# Patient Record
Sex: Female | Born: 1960 | Race: White | Hispanic: No | Marital: Married | Smoking: Never smoker
Health system: Southern US, Community
[De-identification: ages and names within clinical notes are randomized; demographics above are authoritative.]

## PROBLEM LIST (undated history)

## (undated) DIAGNOSIS — F419 Anxiety disorder, unspecified: Secondary | ICD-10-CM

## (undated) DIAGNOSIS — C50919 Malignant neoplasm of unspecified site of unspecified female breast: Secondary | ICD-10-CM

## (undated) DIAGNOSIS — I10 Essential (primary) hypertension: Secondary | ICD-10-CM

## (undated) HISTORY — PX: ABDOMINAL HYSTERECTOMY: SHX81

---

## 2010-06-28 DIAGNOSIS — C50919 Malignant neoplasm of unspecified site of unspecified female breast: Secondary | ICD-10-CM

## 2010-06-28 HISTORY — DX: Malignant neoplasm of unspecified site of unspecified female breast: C50.919

## 2010-06-28 HISTORY — PX: BREAST LUMPECTOMY: SHX2

## 2018-07-30 ENCOUNTER — Emergency Department (INDEPENDENT_AMBULATORY_CARE_PROVIDER_SITE_OTHER)
Admission: EM | Admit: 2018-07-30 | Discharge: 2018-07-30 | Disposition: A | Discharge status: Home / Self Care | Payer: BC Managed Care – PPO | Source: Home / Self Care | Attending: Family Medicine | Admitting: Family Medicine

## 2018-07-30 ENCOUNTER — Emergency Department (INDEPENDENT_AMBULATORY_CARE_PROVIDER_SITE_OTHER): Payer: BC Managed Care – PPO

## 2018-07-30 DIAGNOSIS — R05 Cough: Secondary | ICD-10-CM | POA: Diagnosis not present

## 2018-07-30 DIAGNOSIS — R69 Illness, unspecified: Secondary | ICD-10-CM

## 2018-07-30 DIAGNOSIS — R110 Nausea: Secondary | ICD-10-CM

## 2018-07-30 DIAGNOSIS — R509 Fever, unspecified: Secondary | ICD-10-CM

## 2018-07-30 DIAGNOSIS — J111 Influenza due to unidentified influenza virus with other respiratory manifestations: Secondary | ICD-10-CM

## 2018-07-30 HISTORY — DX: Anxiety disorder, unspecified: F41.9

## 2018-07-30 HISTORY — DX: Malignant neoplasm of unspecified site of unspecified female breast: C50.919

## 2018-07-30 HISTORY — DX: Essential (primary) hypertension: I10

## 2018-07-30 MED ORDER — ONDANSETRON 4 MG PO TBDP
4.0000 mg | ORAL_TABLET | Freq: Once | ORAL | Status: AC
  Administered 2018-07-30: 4 mg via ORAL

## 2018-07-30 MED ORDER — ONDANSETRON 4 MG PO TBDP: ORAL_TABLET | ORAL | 0 refills | Status: AC

## 2018-07-30 MED ORDER — AZITHROMYCIN 250 MG PO TABS: ORAL_TABLET | ORAL | 0 refills | Status: AC

## 2018-07-30 NOTE — ED Triage Notes (Signed)
Erin Leblanc complains of body aches, fever and cough. She just returned last week from a cruise to the [place]. She states she had a fever of 101.0 on Friday. Saturday she had vomiting. Today she feels dehydrated.

## 2018-07-30 NOTE — ED Provider Notes (Signed)
[facility]    CSN: [id number] Arrival date & time: 07/30/18  1110     History   Chief Complaint Chief Complaint  Patient presents with  . Cough  . Fever  . Generalized Body Aches    HPI Erin Leblanc is a 58 y.o. female.   Patient returned from a cruise to the [place] last week. Six days ago patient developed flu-like illness including myalgias, fever 101.5/chills, fatigue, sore throat and cough.  She has had minimal nasal congestion.  Her cough is non-productive and somewhat worse at night.  Yesterday she developed nausea/vomiting and today she feels dehydrated although her vomiting has ceased.  The history is provided by the patient.    Past Medical History:  Diagnosis Date  . Anxiety   . Breast cancer ([place]) 2012  . Hypertension     Active problems:  Adjustment disorder with mixed anxiety and depressed mood; hypertension    Past Surgical History:  Procedure Laterality Date  . ABDOMINAL HYSTERECTOMY    . BREAST LUMPECTOMY Left 2012       Home Medications    Prior to Admission medications   Medication Sig Start Date End Date Taking? Authorizing Provider  benzonatate (TESSALON) 100 MG capsule Take 100 mg by mouth 3 (three) times daily as needed. 07/27/18  Yes [provider]  hydrochlorothiazide (HYDRODIURIL) 12.5 MG tablet Take 12.5 mg by mouth daily.   Yes [provider]  NON FORMULARY [facility] tablet once a day   Yes [provider]  venlafaxine XR (EFFEXOR-XR) 37.5 MG 24 hr capsule Take 37.5 mg by mouth daily. 02/15/13  Yes [provider]  azithromycin (ZITHROMAX Z-PAK) 250 MG tablet Take 2 tabs today; then begin one tab once daily for 4 more days. 07/30/18   Kandra Nicolas, MD  ondansetron (ZOFRAN ODT) 4 MG disintegrating tablet Take one tab by mouth Q6hr prn nausea.  Dissolve under tongue. 07/30/18   Kandra Nicolas, MD    Family History Family History  Problem Relation Age of Onset  . Diabetes  Father     Social History Social History   Tobacco Use  . Smoking status: Never Smoker  . Smokeless tobacco: Never Used  Substance Use Topics  . Alcohol use: Yes    Comment: rare  . Drug use: Never     Allergies   Iodinated diagnostic agents and Shellfish allergy   Review of Systems Review of Systems + sore throat + cough No pleuritic pain No wheezing ? nasal congestion No post-nasal drainage No sinus pain/pressure No itchy/red eyes No earache No hemoptysis No SOB + fever, + chills + nausea + vomiting, resolved No abdominal pain No diarrhea No urinary symptoms No skin rash + fatigue + myalgias No headache Used OTC meds without relief   Physical Exam Triage Vital Signs ED Triage Vitals  Enc Vitals Group     BP 07/30/18 1137 138/84     Pulse Rate 07/30/18 1137 74     Resp 07/30/18 1137 16     Temp 07/30/18 1137 98.1 F (36.7 C)     Temp Source 07/30/18 1137 Oral     SpO2 07/30/18 1137 100 %     Weight 07/30/18 1138 159 lb (72.1 kg)     Height 07/30/18 1138 5\' 5"  (1.651 m)     Head Circumference --      Peak Flow --      Pain Score 07/30/18 1138 0     Pain Loc --  Pain Edu? --      Excl. in [place]? --    No data found.  Updated Vital Signs BP 138/84 (BP Location: Right Arm)   Pulse 74   Temp 98.1 F (36.7 C) (Oral)   Resp 16   Ht 5\' 5"  (1.651 m)   Wt 72.1 kg   SpO2 100%   BMI 26.46 kg/m   Visual Acuity Right Eye Distance:   Left Eye Distance:   Bilateral Distance:    Right Eye Near:   Left Eye Near:    Bilateral Near:     Physical Exam Nursing notes and Vital Signs reviewed. Appearance:  Patient appears stated age, and in no acute distress Eyes:  Pupils are equal, round, and reactive to light and accomodation.  Extraocular movement is intact.  Conjunctivae are not inflamed  Ears:  Canals normal.  Tympanic membranes normal.  Nose:  Mildly congested turbinates.  No sinus tenderness. Mouth:  Decreased moisture  Pharynx:   Normal Neck:  Supple.  Enlarged posterior/lateral nodes are palpated bilaterally, tender to palpation on the left.   Lungs:  Clear to auscultation.  Breath sounds are equal.  Moving air well. Heart:  Regular rate and rhythm without murmurs, rubs, or gallops.  Abdomen:  Nontender without masses or hepatosplenomegaly.  Bowel sounds are present.  No CVA or flank tenderness.  Extremities:  No edema.  Skin:  No rash present.    UC Treatments / Results  Labs (all labs ordered are listed, but only abnormal results are displayed) Labs Reviewed  POCT CBC W AUTO DIFF ([facility]):  WBC 3.9; LY 39.6; MO 7.7; GR 52.7; Hgb 11.7; Platelets 259     EKG None  Radiology Dg Chest 2 View  Result Date: 07/30/2018 CLINICAL DATA:  Body aches, fever and cough for 6 days. EXAM: CHEST - 2 VIEW COMPARISON:  None. FINDINGS: Lungs clear. Heart size normal. No pneumothorax or pleural effusion. No acute or focal bony abnormality. IMPRESSION: No acute disease. Electronically Signed   By: Inge Rise M.D.   On: 07/30/2018 12:07    Procedures Procedures (including critical care time)  Medications Ordered in UC Medications  ondansetron (ZOFRAN-ODT) disintegrating tablet 4 mg (has no administration in time range)    Initial Impression / Assessment and Plan / UC Course  I have reviewed the triage vital signs and the nursing notes.  Pertinent labs & imaging results that were available during my care of the patient were reviewed by me and considered in my medical decision making (see chart for details).    Patient has missed 48 hour window of opportunity for beginning Tamiflu.  Negative chest x-ray reassuring.  Note mild leukopenia on CBC. Administered Zofran ODT 4mg  PO; given Rx for same. Begin empiric Z-pak. Followup with Family Doctor if not improved in about 5 days.   Final Clinical Impressions(s) / UC Diagnoses   Final diagnoses:  Nausea without vomiting  Influenza-like illness      Discharge Instructions     Take plain guaifenesin (1200mg  extended release tabs such as Mucinex) twice daily, with plenty of water, for cough and congestion. Get adequate rest.   May use Afrin nasal spray (or generic oxymetazoline) each morning for about 5 days and then discontinue.  Also recommend using saline nasal spray several times daily and saline nasal irrigation (AYR is a common brand).   Try warm salt water gargles for sore throat.  May take Delsym Cough Suppressant at bedtime for nighttime cough.  May take Ibuprofen 200mg , 4 tabs every 8 hours with food for body aches, fever, etc.      ED Prescriptions    Medication Sig Dispense Auth. Provider   azithromycin (ZITHROMAX Z-PAK) 250 MG tablet Take 2 tabs today; then begin one tab once daily for 4 more days. 6 tablet Kandra Nicolas, MD   ondansetron (ZOFRAN ODT) 4 MG disintegrating tablet Take one tab by mouth Q6hr prn nausea.  Dissolve under tongue. 12 tablet Kandra Nicolas, MD        Kandra Nicolas, MD 07/31/18 Kathyrn Drown

## 2018-07-30 NOTE — Discharge Instructions (Addendum)
Take plain guaifenesin (1200mg  extended release tabs such as Mucinex) twice daily, with plenty of water, for cough and congestion. Get adequate rest.   May use Afrin nasal spray (or generic oxymetazoline) each morning for about 5 days and then discontinue.  Also recommend using saline nasal spray several times daily and saline nasal irrigation (AYR is a common brand).   Try warm salt water gargles for sore throat.  May take Delsym Cough Suppressant at bedtime for nighttime cough.  May take Ibuprofen 200mg , 4 tabs every 8 hours with food for body aches, fever, etc.

## 2018-08-02 LAB — POCT CBC W AUTO DIFF (K'VILLE URGENT CARE)

## 2019-08-02 ENCOUNTER — Other Ambulatory Visit: Payer: Self-pay

## 2019-08-02 ENCOUNTER — Emergency Department (INDEPENDENT_AMBULATORY_CARE_PROVIDER_SITE_OTHER): Payer: BC Managed Care – PPO

## 2019-08-02 ENCOUNTER — Emergency Department (INDEPENDENT_AMBULATORY_CARE_PROVIDER_SITE_OTHER)
Admission: EM | Admit: 2019-08-02 | Discharge: 2019-08-02 | Disposition: A | Discharge status: Home / Self Care | Payer: BC Managed Care – PPO | Source: Home / Self Care | Attending: Family Medicine | Admitting: Family Medicine

## 2019-08-02 DIAGNOSIS — S335XXA Sprain of ligaments of lumbar spine, initial encounter: Secondary | ICD-10-CM | POA: Diagnosis not present

## 2019-08-02 DIAGNOSIS — M4316 Spondylolisthesis, lumbar region: Secondary | ICD-10-CM | POA: Diagnosis not present

## 2019-08-02 DIAGNOSIS — M5431 Sciatica, right side: Secondary | ICD-10-CM

## 2019-08-02 MED ORDER — METHYLPREDNISOLONE ACETATE 80 MG/ML IJ SUSP
80.0000 mg | Freq: Once | INTRAMUSCULAR | Status: AC
  Administered 2019-08-02: 13:00:00 80 mg via INTRAMUSCULAR

## 2019-08-02 NOTE — ED Provider Notes (Signed)
[facility]    CSN: [id number] Arrival date & time: 08/02/19  1123      History   Chief Complaint Chief Complaint  Patient presents with  . Back Pain    HPI Behati Newsum is a 59 y.o. female.   Patient was playing soccer with kids at 4pm yesterday when she fell, landing on her right side.  She had minimal discomfort initially, but later had intermittent right lower back pain with lancinating pain to her right lateral leg.  Today she has persistent right lower back ache radiating intermittently to her right posterior thigh.   She denies bowel or bladder dysfunction, and no saddle numbness.   The history is provided by the patient.  Back Pain Location:  Lumbar spine Quality:  Aching Radiates to:  R posterior upper leg Pain severity:  Moderate Pain is:  Worse during the day Onset quality:  Gradual Duration:  1 day Timing:  Intermittent Progression:  Unchanged Chronicity:  New Context: falling   Relieved by:  Being still Worsened by:  Ambulation Ineffective treatments:  NSAIDs Associated symptoms: paresthesias   Associated symptoms: no abdominal pain, no abdominal swelling, no bladder incontinence, no bowel incontinence, no chest pain, no dysuria, no fever, no leg pain, no numbness, no pelvic pain, no perianal numbness, no tingling and no weakness     Past Medical History:  Diagnosis Date  . Anxiety   . Breast cancer ([place]) 2012  . Hypertension     Active problems: Essential hypertension 08/23/2018  Recurrent major depressive disorder, in full remission 08/23/2018  Actinic keratosis 08/23/2018  Ganglion cyst 08/23/2018  Microcytic anemia 09/25/2015  Thalassemia trait 03/18/2013  Overview:   History of microcytic anemia or family history of Mediterranean anemia. 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 ABDOMINAL HYSTERECTOMY    . BREAST LUMPECTOMY Left 2012       Home Medications    Prior to Admission medications   Medication Sig Start Date End Date Taking? Authorizing Provider  azithromycin (ZITHROMAX Z-PAK) 250 MG tablet Take 2 tabs today; then begin one tab once daily for 4 more days. 07/30/18   Kandra Nicolas, MD  benzonatate (TESSALON) 100 MG capsule Take 100 mg by mouth 3 (three) times daily as needed. 07/27/18   [provider]  hydrochlorothiazide (HYDRODIURIL) 12.5 MG tablet Take 12.5 mg by mouth daily.    [provider]  NON FORMULARY [facility] tablet once a day    [provider]  ondansetron (ZOFRAN ODT) 4 MG disintegrating tablet Take one tab by mouth Q6hr prn nausea.  Dissolve under tongue. 07/30/18   Kandra Nicolas, MD  venlafaxine XR (EFFEXOR-XR) 37.5 MG 24 hr capsule Take 37.5 mg by mouth daily. 02/15/13   [provider]    Family History Family History  Problem Relation Age of Onset  . Diabetes Father     Social History Social History   Tobacco Use  . Smoking status: Never Smoker  . Smokeless tobacco: Never Used  Substance Use Topics  . Alcohol use: Yes  Comment: rare  . Drug use: Never     Allergies   Iodinated diagnostic agents and Shellfish allergy   Review of Systems Review of Systems  Constitutional: Negative for fever.  Cardiovascular: Negative for chest pain.  Gastrointestinal: Negative for abdominal pain and bowel incontinence.  Genitourinary: Negative for bladder incontinence, dysuria and pelvic pain.  Musculoskeletal: Positive for back pain.  Neurological: Positive for paresthesias. Negative for tingling, weakness and  numbness.  All other systems reviewed and are negative.    Physical Exam Triage Vital Signs ED Triage Vitals  Enc Vitals Group     BP 08/02/19 1143 (!) 142/88     Pulse Rate 08/02/19 1143 78     Resp 08/02/19 1143 20     Temp 08/02/19 1143 (!) 96.9 F (36.1 C)     Temp Source 08/02/19 1143 Oral     SpO2 08/02/19 1143 99 %     Weight 08/02/19 1145 169 lb (76.7 kg)     Height 08/02/19 1145 5\' 5"  (1.651 m)     Head Circumference --      Peak Flow --      Pain Score 08/02/19 1144 0     Pain Loc --      Pain Edu? --      Excl. in [place]? --    No data found.  Updated Vital Signs BP (!) 142/88 (BP Location: Right Arm)   Pulse 78   Temp (!) 96.9 F (36.1 C) (Oral)   Resp 20   Ht 5\' 5"  (1.651 m)   Wt 76.7 kg   SpO2 99%   BMI 28.12 kg/m   Visual Acuity Right Eye Distance:   Left Eye Distance:   Bilateral Distance:    Right Eye Near:   Left Eye Near:    Bilateral Near:     Physical Exam Vitals and nursing note reviewed.  Constitutional:      General: She is not in acute distress. HENT:     Head: Normocephalic.  Eyes:     Pupils: Pupils are equal, round, and reactive to light.  Cardiovascular:     Heart sounds: Normal heart sounds.  Pulmonary:     Breath sounds: Normal breath sounds.  Abdominal:     General: Abdomen is flat.     Tenderness: There is no abdominal tenderness.  Musculoskeletal:     Cervical back: Normal range of motion.     Lumbar back: Tenderness and bony tenderness present. No swelling or deformity. Normal range of motion. Negative right straight leg raise test and negative left straight leg raise test. No scoliosis.       Back:     Comments: Back:  Range of motion decreased. Can heel/toe walk and squat without difficulty. Tenderness in the midline approximately L3.  No paraspinous tenderness.  Straight leg raising test is negative.  Sitting knee extension test is negative.  Strength and sensation in the lower extremities is normal.  Patellar and  achilles reflexes are normal   Skin:    General: Skin is warm and dry.  Neurological:     General: No focal deficit present.     Mental Status: She is alert.      UC Treatments / Results  Labs (all labs ordered are listed, but only abnormal results are displayed) Labs Reviewed - No data to display  EKG   Radiology DG Lumbar Spine Complete  Result Date: 08/02/2019 CLINICAL DATA:  [place] yesterday, intermittent right lower back pain and  right lower extremity radicular pain EXAM: LUMBAR SPINE - COMPLETE 4+ VIEW COMPARISON:  None. FINDINGS: Frontal, bilateral oblique, lateral views of the lumbar spine are obtained. There is grade 1 anterolisthesis of L5 on S1, with evidence of bilateral pars defects. Mild right convex scoliosis centered at L3. No acute displaced fractures. There is mild disc space narrowing and osteophyte formation at L4/L5 and L5/S1. Prominent facet hypertrophy is seen from L3/L4 through L5/S1. Sacroiliac joints are normal. IMPRESSION: 1. 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 2. Mild right convex scoliosis. 3. Lower lumbar spondylosis and facet hypertrophy. Electronically Signed   By: Randa Ngo M.D.   On: 08/02/2019 12:31    Procedures Procedures (including critical care time)  Medications Ordered in UC Medications  methylPREDNISolone acetate (DEPO-MEDROL) injection 80 mg (has no administration in time range)    Initial Impression / Assessment and Plan / UC Course  I have reviewed the triage vital signs and the nursing notes.  Pertinent labs & imaging results that were available during my care of the patient were reviewed by me and considered in my medical decision making (see chart for details).    Administered Depo Medrol 80mg  IM. Followup with Dr. Aundria Mems ([facility]) if not improving about two weeks.    Final Clinical Impressions(s) / UC Diagnoses   Final diagnoses:  Low back sprain, initial  encounter  Sciatica of right side     Discharge Instructions     Apply ice pack to lower back for 20 to 30 minutes, 3 to 4 times daily  Continue until pain decreases.  May take Tylenol as needed for pain.  If symptoms become significantly worse during the night or over the weekend, proceed to the local emergency room.     ED Prescriptions    None        Kandra Nicolas, MD 08/04/19 [phone]

## 2019-08-02 NOTE — ED Triage Notes (Signed)
Yesterday afternoon was playing soccer with some kids, and went down.  Got back up, but back pain has increased since.  Pain is on the lower back right side, and yesterday had pain shooting down into thigh.  Saw chiropractor last night

## 2019-08-02 NOTE — Discharge Instructions (Addendum)
Apply ice pack to lower back for 20 to 30 minutes, 3 to 4 times daily  Continue until pain decreases.  May take Tylenol as needed for pain.  If symptoms become significantly worse during the night or over the weekend, proceed to the local emergency room.

## 2021-09-28 IMAGING — DX DG LUMBAR SPINE COMPLETE 4+V
5 series · 5 of 5 positions shown · non-contrast
Comparison: None.

CLINICAL DATA: Fell yesterday, intermittent right lower back pain
and right lower extremity radicular pain

EXAM:
LUMBAR SPINE - COMPLETE 4+ VIEW

[l-spine ap]
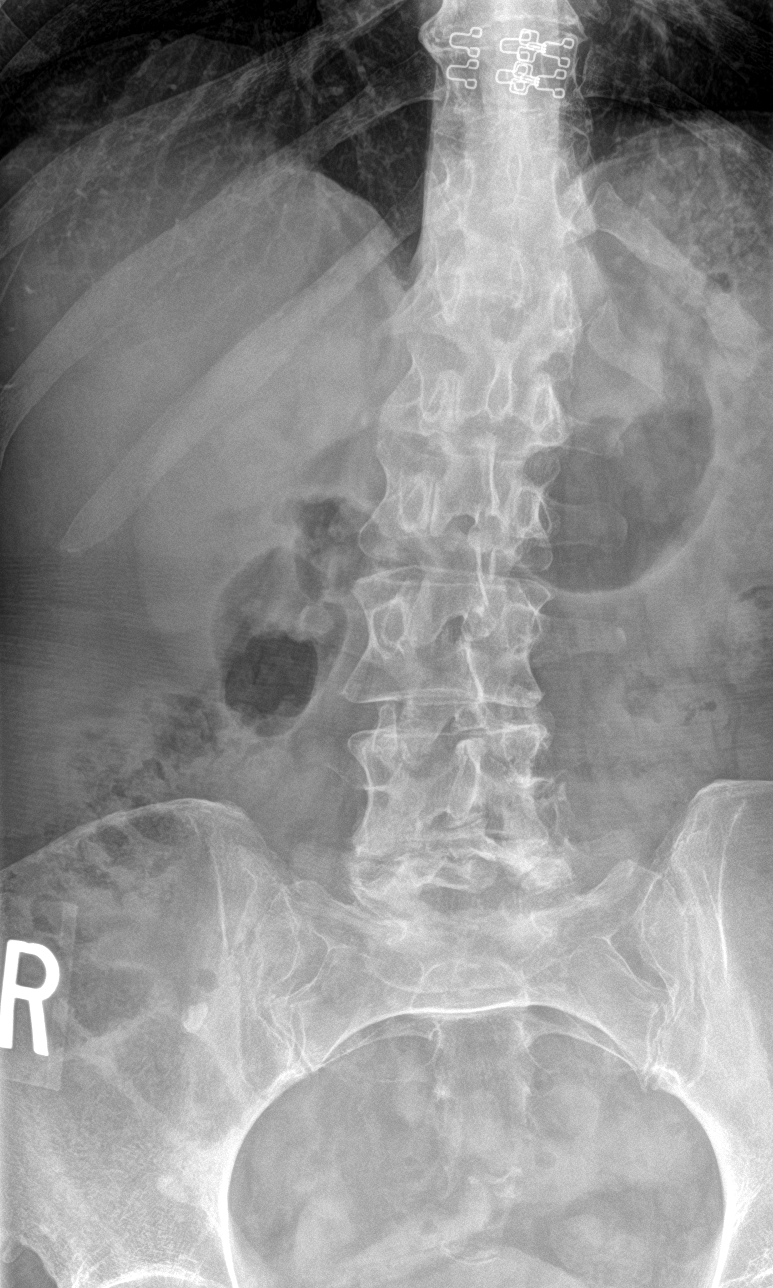

[l-spine obl (1 of 2)]
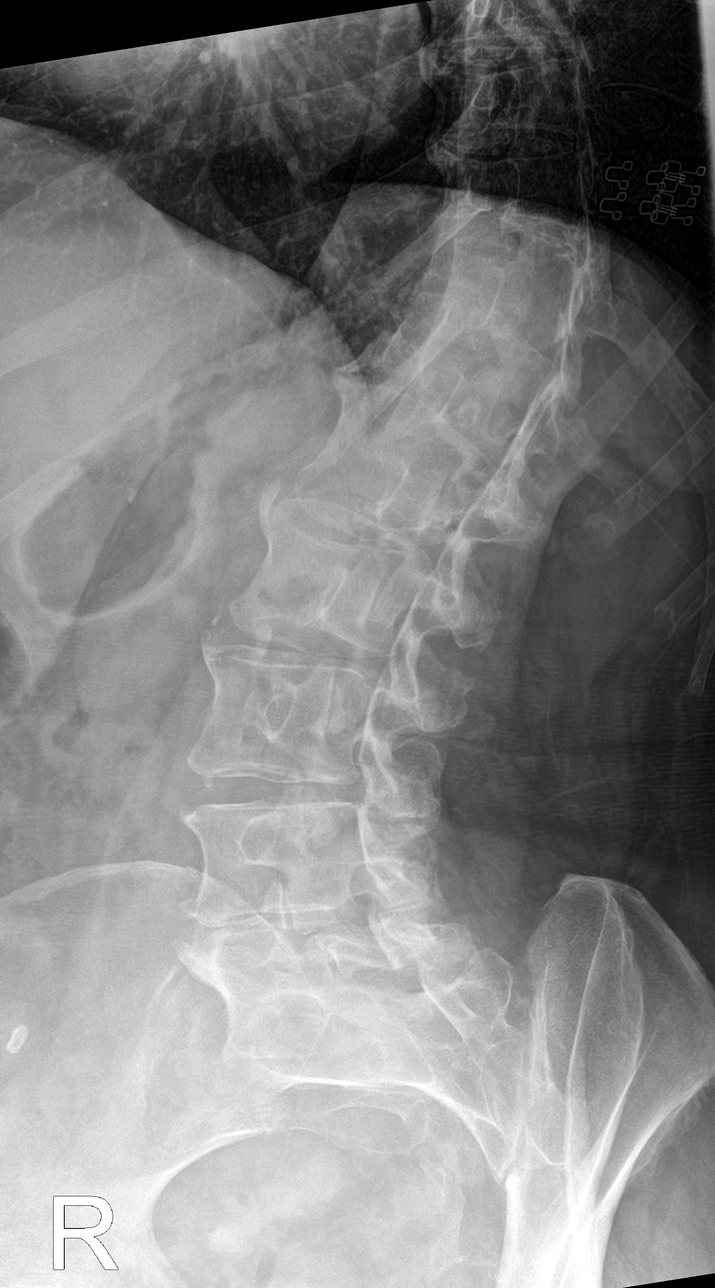

[l-spine obl (2 of 2)]
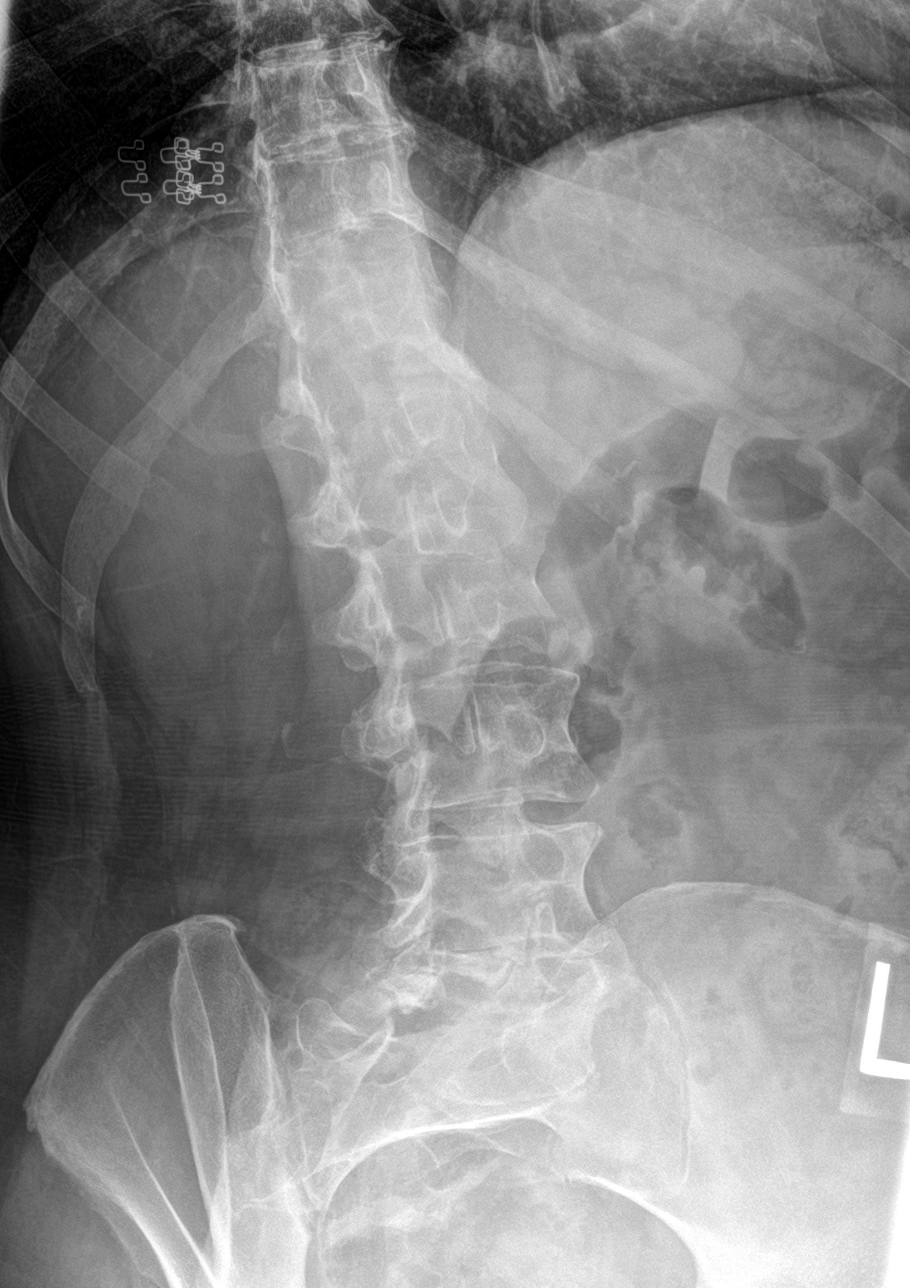

[l-spine lat]
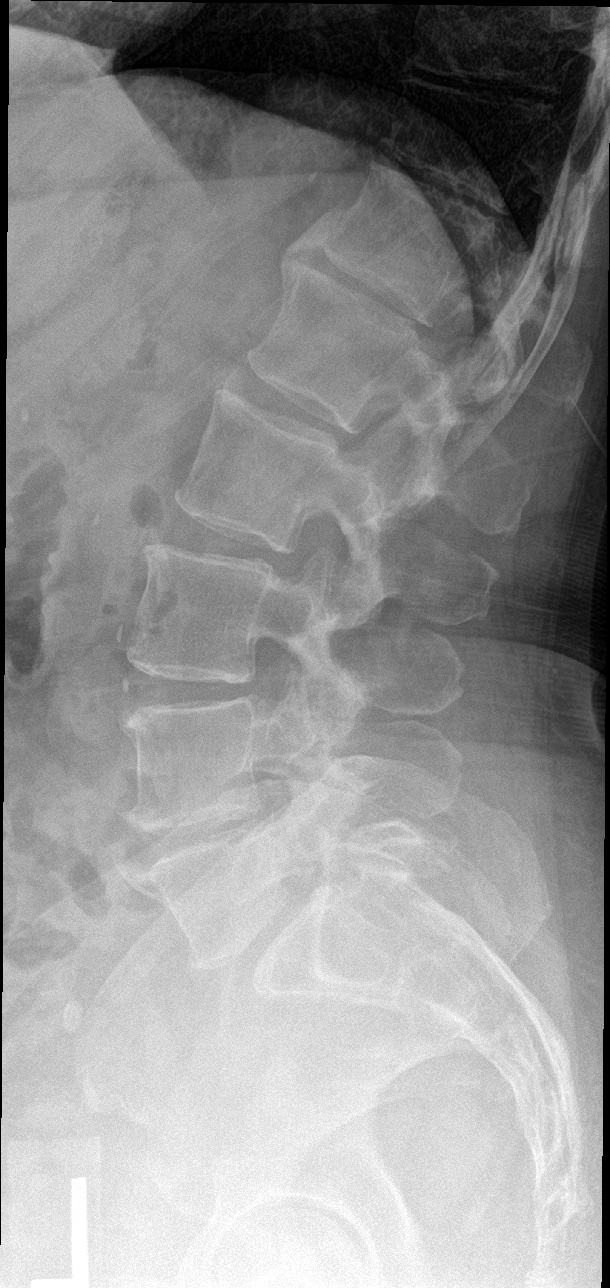

[l-spine spot]
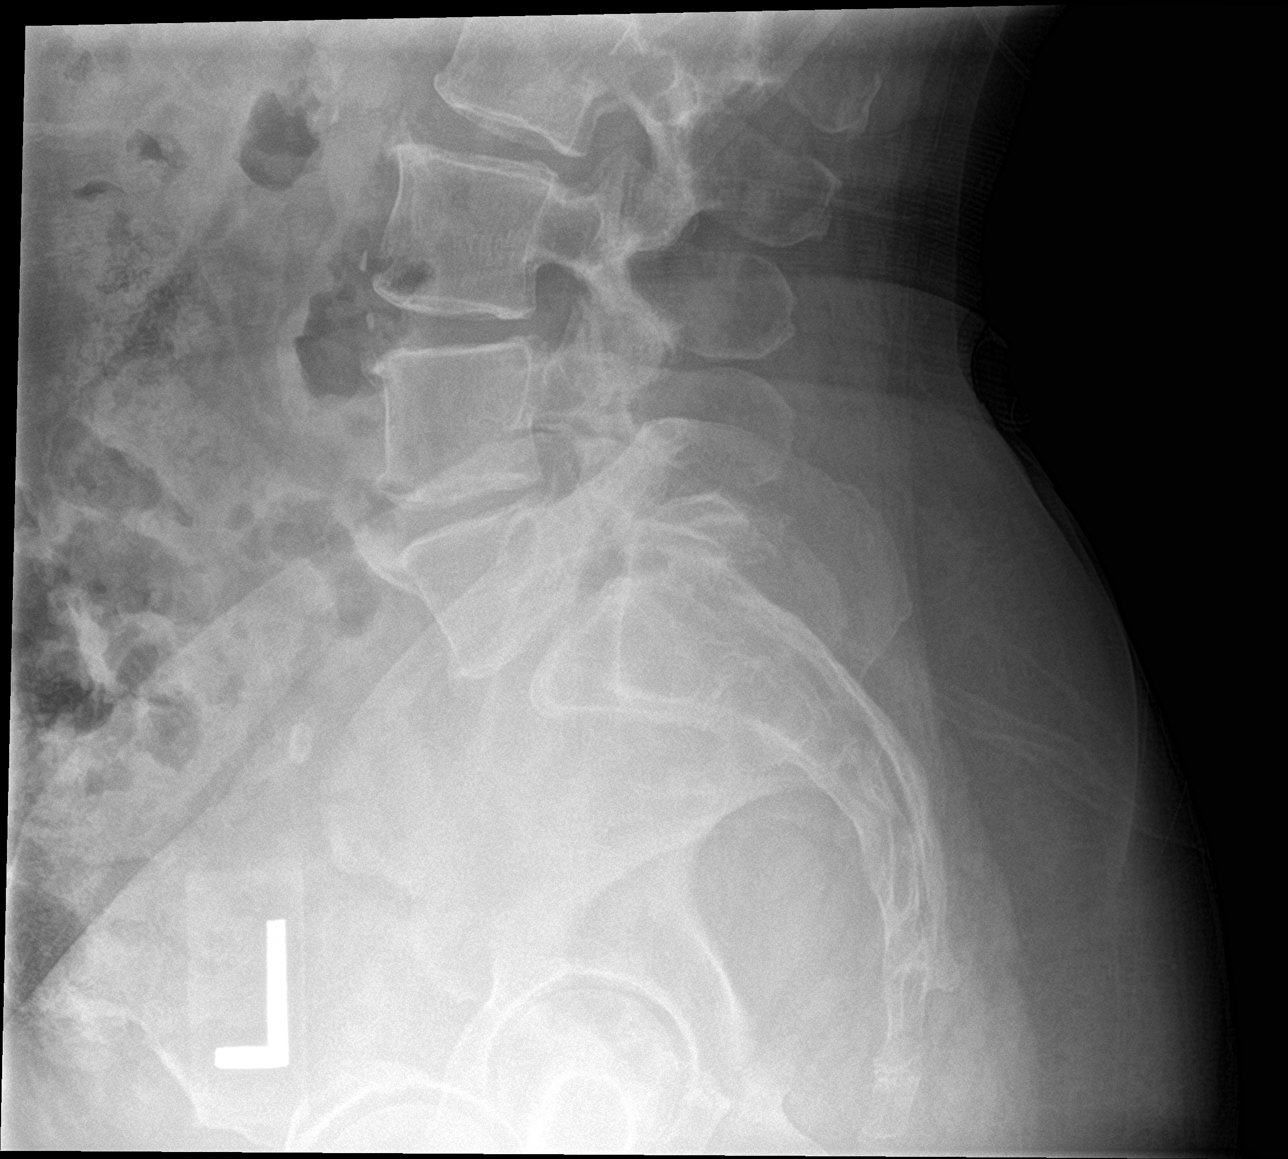

[5 of 5 positions shown; findings below may reference images not displayed]

FINDINGS: Frontal, bilateral oblique, lateral views of the lumbar spine are
obtained. There is grade 1 anterolisthesis of L5 on S1, with
evidence of bilateral pars defects. Mild right convex scoliosis
centered at L3. No acute displaced fractures.

There is mild disc space narrowing and osteophyte formation at L4/L5
and L5/S1. Prominent facet hypertrophy is seen from L3/L4 through
L5/S1. Sacroiliac joints are normal.
IMPRESSION: 1. Grade 1 anterolisthesis of L5 on S1 with bilateral L5
spondylolysis.
2. Mild right convex scoliosis.
3. Lower lumbar spondylosis and facet hypertrophy.
